# Patient Record
Sex: Female | Born: 1995 | Race: Black or African American | Hispanic: No | Marital: Single
Health system: Southern US, Community
[De-identification: ages and names within clinical notes are randomized; demographics above are authoritative.]

---

## 2017-10-22 ENCOUNTER — Emergency Department (HOSPITAL_COMMUNITY): Payer: No Typology Code available for payment source

## 2017-10-22 ENCOUNTER — Emergency Department (HOSPITAL_COMMUNITY)
Admission: EM | Admit: 2017-10-22 | Discharge: 2017-10-22 | Disposition: A | Discharge status: Home / Self Care | Payer: No Typology Code available for payment source | Attending: Emergency Medicine | Admitting: Emergency Medicine

## 2017-10-22 ENCOUNTER — Other Ambulatory Visit: Payer: Self-pay

## 2017-10-22 DIAGNOSIS — T68XXXA Hypothermia, initial encounter: Secondary | ICD-10-CM | POA: Insufficient documentation

## 2017-10-22 DIAGNOSIS — X31XXXA Exposure to excessive natural cold, initial encounter: Secondary | ICD-10-CM | POA: Insufficient documentation

## 2017-10-22 LAB — COMPREHENSIVE METABOLIC PANEL
ALBUMIN: 4 g/dL (ref 3.5–5.0)
ALK PHOS: 61 U/L (ref 38–126)
ALT: 15 U/L (ref 14–54)
AST: 26 U/L (ref 15–41)
Anion gap: 14 (ref 5–15)
BILIRUBIN TOTAL: 0.9 mg/dL (ref 0.3–1.2)
BUN: 8 mg/dL (ref 6–20)
CALCIUM: 8 mg/dL — AB (ref 8.9–10.3)
CO2: 16 mmol/L — ABNORMAL LOW (ref 22–32)
CREATININE: 0.79 mg/dL (ref 0.44–1.00)
Chloride: 106 mmol/L (ref 101–111)
GFR calc Af Amer: >60 mL/min (ref >60)
GFR calc non Af Amer: >60 mL/min (ref >60)
Glucose, Bld: 147 mg/dL — ABNORMAL HIGH (ref 65–99)
Potassium: 3.2 mmol/L — ABNORMAL LOW (ref 3.5–5.1)
Sodium: 136 mmol/L (ref 135–145)
TOTAL PROTEIN: 6.3 g/dL — AB (ref 6.5–8.1)

## 2017-10-22 LAB — CBC WITH DIFFERENTIAL/PLATELET
ABS IMMATURE GRANULOCYTES: 0 10*3/uL (ref 0.0–0.1)
BASOS ABS: 0.1 10*3/uL (ref 0.0–0.1)
BASOS PCT: 1 %
EOS ABS: 0.2 10*3/uL (ref 0.0–0.7)
Eosinophils Relative: 3 %
HCT: 35 % — ABNORMAL LOW (ref 36.0–46.0)
Hemoglobin: 11.5 g/dL — ABNORMAL LOW (ref 12.0–15.0)
IMMATURE GRANULOCYTES: 0 %
Lymphocytes Relative: 56 %
Lymphs Abs: 3.6 10*3/uL (ref 0.7–4.0)
MCH: 29.3 pg (ref 26.0–34.0)
MCHC: 32.9 g/dL (ref 30.0–36.0)
MCV: 89.3 fL (ref 78.0–100.0)
MONO ABS: 0.4 10*3/uL (ref 0.1–1.0)
MONOS PCT: 7 %
NEUTROS ABS: 2.1 10*3/uL (ref 1.7–7.7)
NEUTROS PCT: 33 %
PLATELETS: 260 10*3/uL (ref 150–400)
RBC: 3.92 MIL/uL (ref 3.87–5.11)
RDW: 12.4 % (ref 11.5–15.5)
WBC: 6.3 10*3/uL (ref 4.0–10.5)

## 2017-10-22 LAB — I-STAT BETA HCG BLOOD, ED (MC, WL, AP ONLY)
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
## 2017-10-22 NOTE — ED Provider Notes (Signed)
Pt  Reevaluated at 8:00 and 9:45.  Pt warmed up, awake and alert and wants to go home.  Ct's reviewed, labs reviewed.  Pt advised tylenol or ibuprofen for pain.  Return if any problems.    Elson [place], [place] 10/22/17 4098    Benjiman Core, MD 10/22/17 [phone]

## 2017-10-22 NOTE — ED Notes (Signed)
2 failed attempts for both an axillary and oral temperatures.

## 2017-10-22 NOTE — Discharge Instructions (Addendum)
Tylenol or ibuprofen for pain.  Return if any problems.  

## 2017-10-22 NOTE — ED Notes (Signed)
[employer] bedside 

## 2017-10-22 NOTE — ED Notes (Signed)
Pt came to hallway states she feels better ready to go home, called at friend that is coming up front to take her home, pt escorted to lobby to wait for friend.

## 2017-10-24 NOTE — ED Notes (Signed)
10/24/2017,  Pt. Called and requested a call back.  Called listed phone number, no answer, unable to leave a message.
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
